# Patient Record
Sex: Male | Born: 1935 | Race: White | Hispanic: No | Marital: Married | Smoking: Former smoker
Health system: Southern US, Community
[De-identification: ages and names within clinical notes are randomized; demographics above are authoritative.]

## PROBLEM LIST (undated history)

## (undated) DIAGNOSIS — K219 Gastro-esophageal reflux disease without esophagitis: Secondary | ICD-10-CM

## (undated) DIAGNOSIS — M5126 Other intervertebral disc displacement, lumbar region: Secondary | ICD-10-CM

## (undated) DIAGNOSIS — J189 Pneumonia, unspecified organism: Secondary | ICD-10-CM

## (undated) DIAGNOSIS — Z8711 Personal history of peptic ulcer disease: Secondary | ICD-10-CM

## (undated) DIAGNOSIS — E7800 Pure hypercholesterolemia, unspecified: Secondary | ICD-10-CM

## (undated) DIAGNOSIS — Z8719 Personal history of other diseases of the digestive system: Secondary | ICD-10-CM

## (undated) DIAGNOSIS — M1990 Unspecified osteoarthritis, unspecified site: Secondary | ICD-10-CM

## (undated) DIAGNOSIS — K759 Inflammatory liver disease, unspecified: Secondary | ICD-10-CM

## (undated) HISTORY — PX: FRACTURE SURGERY: SHX138

## (undated) HISTORY — PX: CATARACT EXTRACTION W/ INTRAOCULAR LENS  IMPLANT, BILATERAL: SHX1307

## (undated) HISTORY — PX: ANKLE SURGERY: SHX546

## (undated) HISTORY — PX: COLONOSCOPY: SHX174

## (undated) HISTORY — PX: LIGAMENT REPAIR: SHX5444

## (undated) HISTORY — PX: INGUINAL HERNIA REPAIR: SUR1180

## (undated) HISTORY — PX: ESOPHAGOGASTRODUODENOSCOPY (EGD) WITH ESOPHAGEAL DILATION: SHX5812

---
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
## 2018-03-13 ENCOUNTER — Encounter (HOSPITAL_COMMUNITY): Payer: Self-pay | Admitting: Emergency Medicine

## 2018-03-13 ENCOUNTER — Emergency Department (HOSPITAL_COMMUNITY): Payer: Medicare Other

## 2018-03-13 ENCOUNTER — Other Ambulatory Visit: Payer: Self-pay

## 2018-03-13 ENCOUNTER — Inpatient Hospital Stay (HOSPITAL_COMMUNITY)
Admission: EM | Admit: 2018-03-13 | Discharge: 2018-03-14 | DRG: 193 | Disposition: A | Discharge status: Home / Self Care | Payer: Medicare Other | Attending: Internal Medicine | Admitting: Internal Medicine

## 2018-03-13 DIAGNOSIS — Z8701 Personal history of pneumonia (recurrent): Secondary | ICD-10-CM | POA: Diagnosis not present

## 2018-03-13 DIAGNOSIS — J9601 Acute respiratory failure with hypoxia: Secondary | ICD-10-CM | POA: Diagnosis present

## 2018-03-13 DIAGNOSIS — J9811 Atelectasis: Secondary | ICD-10-CM | POA: Diagnosis present

## 2018-03-13 DIAGNOSIS — K219 Gastro-esophageal reflux disease without esophagitis: Secondary | ICD-10-CM | POA: Diagnosis present

## 2018-03-13 DIAGNOSIS — G9349 Other encephalopathy: Secondary | ICD-10-CM | POA: Diagnosis present

## 2018-03-13 DIAGNOSIS — N179 Acute kidney failure, unspecified: Secondary | ICD-10-CM | POA: Diagnosis present

## 2018-03-13 DIAGNOSIS — N400 Enlarged prostate without lower urinary tract symptoms: Secondary | ICD-10-CM | POA: Diagnosis present

## 2018-03-13 DIAGNOSIS — E860 Dehydration: Secondary | ICD-10-CM | POA: Diagnosis present

## 2018-03-13 DIAGNOSIS — Z7982 Long term (current) use of aspirin: Secondary | ICD-10-CM

## 2018-03-13 DIAGNOSIS — J159 Unspecified bacterial pneumonia: Secondary | ICD-10-CM | POA: Diagnosis present

## 2018-03-13 DIAGNOSIS — I10 Essential (primary) hypertension: Secondary | ICD-10-CM | POA: Diagnosis present

## 2018-03-13 DIAGNOSIS — Z87891 Personal history of nicotine dependence: Secondary | ICD-10-CM | POA: Diagnosis not present

## 2018-03-13 DIAGNOSIS — K5790 Diverticulosis of intestine, part unspecified, without perforation or abscess without bleeding: Secondary | ICD-10-CM | POA: Diagnosis not present

## 2018-03-13 DIAGNOSIS — Z79899 Other long term (current) drug therapy: Secondary | ICD-10-CM

## 2018-03-13 DIAGNOSIS — G9340 Encephalopathy, unspecified: Secondary | ICD-10-CM | POA: Diagnosis not present

## 2018-03-13 DIAGNOSIS — A419 Sepsis, unspecified organism: Secondary | ICD-10-CM

## 2018-03-13 DIAGNOSIS — E785 Hyperlipidemia, unspecified: Secondary | ICD-10-CM | POA: Diagnosis present

## 2018-03-13 DIAGNOSIS — Z8619 Personal history of other infectious and parasitic diseases: Secondary | ICD-10-CM | POA: Diagnosis not present

## 2018-03-13 HISTORY — DX: Personal history of other diseases of the digestive system: Z87.19

## 2018-03-13 HISTORY — DX: Gastro-esophageal reflux disease without esophagitis: K21.9

## 2018-03-13 HISTORY — DX: Personal history of peptic ulcer disease: Z87.11

## 2018-03-13 HISTORY — DX: Unspecified osteoarthritis, unspecified site: M19.90

## 2018-03-13 HISTORY — DX: Pure hypercholesterolemia, unspecified: E78.00

## 2018-03-13 HISTORY — DX: Pneumonia, unspecified organism: J18.9

## 2018-03-13 HISTORY — DX: Other intervertebral disc displacement, lumbar region: M51.26

## 2018-03-13 HISTORY — DX: Inflammatory liver disease, unspecified: K75.9

## 2018-03-13 LAB — COMPREHENSIVE METABOLIC PANEL
ALT: 28 U/L (ref 0–44)
AST: 34 U/L (ref 15–41)
Albumin: 3.9 g/dL (ref 3.5–5.0)
Alkaline Phosphatase: 80 U/L (ref 38–126)
Anion gap: 14 (ref 5–15)
BUN: 20 mg/dL (ref 8–23)
CO2: 21 mmol/L — ABNORMAL LOW (ref 22–32)
Calcium: 8.8 mg/dL — ABNORMAL LOW (ref 8.9–10.3)
Chloride: 104 mmol/L (ref 98–111)
Creatinine, Ser: 1.36 mg/dL — ABNORMAL HIGH (ref 0.61–1.24)
GFR calc Af Amer: 57 mL/min — ABNORMAL LOW (ref >60)
GFR calc non Af Amer: 49 mL/min — ABNORMAL LOW (ref >60)
Glucose, Bld: 100 mg/dL — ABNORMAL HIGH (ref 70–99)
Potassium: 3.6 mmol/L (ref 3.5–5.1)
Sodium: 139 mmol/L (ref 135–145)
Total Bilirubin: 1.2 mg/dL (ref 0.3–1.2)
Total Protein: 7 g/dL (ref 6.5–8.1)

## 2018-03-13 LAB — CBC WITH DIFFERENTIAL/PLATELET
Abs Immature Granulocytes: 0.01 10*3/uL (ref 0.00–0.07)
Basophils Absolute: 0 10*3/uL (ref 0.0–0.1)
Basophils Relative: 0 %
Eosinophils Absolute: 0 10*3/uL (ref 0.0–0.5)
Eosinophils Relative: 0 %
HCT: 43 % (ref 39.0–52.0)
Hemoglobin: 13.5 g/dL (ref 13.0–17.0)
Immature Granulocytes: 0 %
Lymphocytes Relative: 6 %
Lymphs Abs: 0.4 10*3/uL — ABNORMAL LOW (ref 0.7–4.0)
MCH: 29.4 pg (ref 26.0–34.0)
MCHC: 31.4 g/dL (ref 30.0–36.0)
MCV: 93.7 fL (ref 80.0–100.0)
Monocytes Absolute: 0 10*3/uL — ABNORMAL LOW (ref 0.1–1.0)
Monocytes Relative: 1 %
Neutro Abs: 5.5 10*3/uL (ref 1.7–7.7)
Neutrophils Relative %: 93 %
Platelets: 192 10*3/uL (ref 150–400)
RBC: 4.59 MIL/uL (ref 4.22–5.81)
RDW: 12.7 % (ref 11.5–15.5)
WBC: 5.9 10*3/uL (ref 4.0–10.5)
nRBC: 0 % (ref 0.0–0.2)

## 2018-03-13 LAB — I-STAT CG4 LACTIC ACID, ED
Lactic Acid, Venous: 3.48 mmol/L (ref 0.5–1.9)
Lactic Acid, Venous: 1.15 mmol/L (ref 0.5–1.9)

## 2018-03-13 LAB — RESPIRATORY PANEL BY PCR
ADENOVIRUS-RVPPCR: NOT DETECTED
Bordetella pertussis: NOT DETECTED
CORONAVIRUS NL63-RVPPCR: NOT DETECTED
CORONAVIRUS OC43-RVPPCR: NOT DETECTED
Chlamydophila pneumoniae: NOT DETECTED
Coronavirus 229E: NOT DETECTED
Coronavirus HKU1: NOT DETECTED
Influenza A: NOT DETECTED
Influenza B: NOT DETECTED
Metapneumovirus: NOT DETECTED
Mycoplasma pneumoniae: NOT DETECTED
Parainfluenza Virus 1: NOT DETECTED
Parainfluenza Virus 2: NOT DETECTED
Parainfluenza Virus 3: NOT DETECTED
Parainfluenza Virus 4: NOT DETECTED
Respiratory Syncytial Virus: NOT DETECTED
Rhinovirus / Enterovirus: NOT DETECTED

## 2018-03-13 LAB — URINALYSIS, ROUTINE W REFLEX MICROSCOPIC
Bacteria, UA: NONE SEEN
Bilirubin Urine: NEGATIVE
Glucose, UA: NEGATIVE mg/dL
Ketones, ur: NEGATIVE mg/dL
Leukocytes, UA: NEGATIVE
Nitrite: NEGATIVE
Protein, ur: NEGATIVE mg/dL
Specific Gravity, Urine: 1.017 (ref 1.005–1.030)
pH: 6 (ref 5.0–8.0)

## 2018-03-13 LAB — INFLUENZA PANEL BY PCR (TYPE A & B)
INFLAPCR: NEGATIVE
Influenza B By PCR: NEGATIVE

## 2018-03-13 LAB — AMMONIA: Ammonia: 31 umol/L (ref 9–35)

## 2018-03-13 LAB — PROCALCITONIN: Procalcitonin: 73.66 ng/mL

## 2018-03-13 LAB — MRSA PCR SCREENING: MRSA by PCR: NEGATIVE

## 2018-03-13 LAB — LIPASE, BLOOD: Lipase: 66 U/L — ABNORMAL HIGH (ref 11–51)

## 2018-03-13 MED ORDER — SODIUM CHLORIDE 0.9 % IV SOLN
2.0000 g | Freq: Once | INTRAVENOUS | Status: AC
  Administered 2018-03-13: 2 g via INTRAVENOUS
  Filled 2018-03-13: qty 2

## 2018-03-13 MED ORDER — METRONIDAZOLE IN NACL 5-0.79 MG/ML-% IV SOLN
500.0000 mg | Freq: Three times a day (TID) | INTRAVENOUS | Status: DC
  Administered 2018-03-13: 500 mg via INTRAVENOUS
  Filled 2018-03-13: qty 100

## 2018-03-13 MED ORDER — ONDANSETRON HCL 4 MG PO TABS: 4.0000 mg | ORAL_TABLET | Freq: Four times a day (QID) | ORAL | Status: DC | PRN

## 2018-03-13 MED ORDER — CYANOCOBALAMIN 500 MCG PO TABS
500.0000 ug | ORAL_TABLET | Freq: Every day | ORAL | Status: DC
  Administered 2018-03-13 – 2018-03-14 (×2): 500 ug via ORAL
  Filled 2018-03-13 (×2): qty 1

## 2018-03-13 MED ORDER — ENOXAPARIN SODIUM 40 MG/0.4ML ~~LOC~~ SOLN
40.0000 mg | SUBCUTANEOUS | Status: DC
  Administered 2018-03-13: 40 mg via SUBCUTANEOUS
  Filled 2018-03-13: qty 0.4

## 2018-03-13 MED ORDER — ONDANSETRON HCL 4 MG/2ML IJ SOLN: 4.0000 mg | Freq: Four times a day (QID) | INTRAMUSCULAR | Status: DC | PRN

## 2018-03-13 MED ORDER — LACTATED RINGERS IV SOLN: INTRAVENOUS | Status: DC

## 2018-03-13 MED ORDER — ALPRAZOLAM 0.25 MG PO TABS
0.2500 mg | ORAL_TABLET | Freq: Every evening | ORAL | Status: DC | PRN
  Administered 2018-03-13: 0.25 mg via ORAL
  Filled 2018-03-13: qty 1

## 2018-03-13 MED ORDER — ACETAMINOPHEN 650 MG RE SUPP: 650.0000 mg | Freq: Four times a day (QID) | RECTAL | Status: DC | PRN

## 2018-03-13 MED ORDER — ASPIRIN EC 81 MG PO TBEC
81.0000 mg | DELAYED_RELEASE_TABLET | Freq: Every day | ORAL | Status: DC
  Administered 2018-03-13 – 2018-03-14 (×2): 81 mg via ORAL
  Filled 2018-03-13 (×2): qty 1

## 2018-03-13 MED ORDER — SENNA 8.6 MG PO TABS
1.0000 | ORAL_TABLET | Freq: Two times a day (BID) | ORAL | Status: DC
  Administered 2018-03-13 – 2018-03-14 (×2): 8.6 mg via ORAL
  Filled 2018-03-13 (×2): qty 1

## 2018-03-13 MED ORDER — LACTATED RINGERS IV SOLN
INTRAVENOUS | Status: AC
  Administered 2018-03-13 – 2018-03-14 (×2): via INTRAVENOUS

## 2018-03-13 MED ORDER — SODIUM CHLORIDE 0.9 % IV BOLUS
1000.0000 mL | Freq: Once | INTRAVENOUS | Status: AC
  Administered 2018-03-13: 1000 mL via INTRAVENOUS

## 2018-03-13 MED ORDER — ACETAMINOPHEN 325 MG PO TABS
650.0000 mg | ORAL_TABLET | Freq: Once | ORAL | Status: AC
  Administered 2018-03-13: 650 mg via ORAL
  Filled 2018-03-13: qty 2

## 2018-03-13 MED ORDER — VANCOMYCIN HCL IN DEXTROSE 1-5 GM/200ML-% IV SOLN: 1000.0000 mg | Freq: Once | INTRAVENOUS | Status: DC

## 2018-03-13 MED ORDER — TAMSULOSIN HCL 0.4 MG PO CAPS
0.4000 mg | ORAL_CAPSULE | Freq: Every day | ORAL | Status: DC
  Administered 2018-03-13 – 2018-03-14 (×2): 0.4 mg via ORAL
  Filled 2018-03-13 (×2): qty 1

## 2018-03-13 MED ORDER — ATORVASTATIN CALCIUM 40 MG PO TABS
40.0000 mg | ORAL_TABLET | Freq: Every day | ORAL | Status: DC
  Administered 2018-03-13 – 2018-03-14 (×2): 40 mg via ORAL
  Filled 2018-03-13 (×2): qty 1

## 2018-03-13 MED ORDER — VANCOMYCIN HCL 10 G IV SOLR: 1250.0000 mg | INTRAVENOUS | Status: DC

## 2018-03-13 MED ORDER — ACETAMINOPHEN 325 MG PO TABS: 650.0000 mg | ORAL_TABLET | Freq: Four times a day (QID) | ORAL | Status: DC | PRN

## 2018-03-13 MED ORDER — VANCOMYCIN HCL 10 G IV SOLR
1500.0000 mg | Freq: Once | INTRAVENOUS | Status: AC
  Administered 2018-03-13: 1500 mg via INTRAVENOUS
  Filled 2018-03-13: qty 1500

## 2018-03-13 MED ORDER — PANTOPRAZOLE SODIUM 40 MG PO TBEC
40.0000 mg | DELAYED_RELEASE_TABLET | Freq: Every day | ORAL | Status: DC
  Administered 2018-03-13 – 2018-03-14 (×2): 40 mg via ORAL
  Filled 2018-03-13 (×2): qty 1

## 2018-03-13 MED ORDER — SODIUM CHLORIDE 0.9 % IV SOLN
2.0000 g | INTRAVENOUS | Status: DC
  Filled 2018-03-13: qty 2

## 2018-03-13 NOTE — ED Notes (Signed)
ED TO INPATIENT HANDOFF REPORT  Name/Age/Gender Jimmy Frank 82 y.o. male  Code Status    Code Status Orders  (From admission, onward)         Start     Ordered   03/13/18 1227  Full code  Continuous     03/13/18 1228        Code Status History    This patient has a current code status but no historical code status.      Home/SNF/Other Home  Chief Complaint sick  Level of Care/Admitting Diagnosis ED Disposition    ED Disposition Condition Comment   Admit  [facility] [[id number]]  Level of Care: Medical Telemetry [104]  Diagnosis: Acute respiratory failure with hypoxia [facility](HCC) [[id number]]) [[id number]]  Admitting Physician: Erenest BlankBUTCHER, ELIZABETH A [[id number]]  Attending Physician: Burns SpainBUTCHER, ELIZABETH A [phone][[id number]]  Estimated length of stay: past midnight tomorrow  Certification:: I certify this patient will need inpatient services for at least 2 midnights  PT Class (Do Not Modify): Inpatient [101]  PT Acc Code (Do Not Modify): Private [1]       Medical History History reviewed. No pertinent past medical history.  Allergies No Known Allergies  IV Location/Drains/Wounds Patient Lines/Drains/Airways Status   Active Line/Drains/Airways    Name:   Placement date:   Placement time:   Site:   Days:   Peripheral IV 03/13/18 Left Antecubital   03/13/18    -    Antecubital   less than 1   Peripheral IV 03/13/18 Right Antecubital   03/13/18    1005    Antecubital   less than 1          Labs/Imaging Results for orders placed or performed during the hospital encounter of 03/13/18 (from the past 48 hour(s))  Ammonia     Status: None   Collection Time: 03/13/18  9:42 AM  Result Value Ref Range   Ammonia 31 9 - 35 umol/L    Comment: Performed at [facility], [address] [place], [place] [address]  Comprehensive metabolic panel     Status: Abnormal   Collection Time: 03/13/18  9:49 AM  Result Value Ref Range   Sodium 139 135 - 145 mmol/L   Potassium 3.6 3.5 - 5.1 mmol/L   Chloride 104 98 - 111 mmol/L   CO2 21 (L) 22 - 32 mmol/L   Glucose, Bld 100 (H) 70 - 99 mg/dL   BUN 20 8 - 23 mg/dL   Creatinine, Ser 8.651.36 (H) 0.61 - 1.24 mg/dL   Calcium 8.8 (L) 8.9 - 10.3 mg/dL   Total Protein 7.0 6.5 - 8.1 g/dL   Albumin 3.9 3.5 - 5.0 g/dL   AST 34 15 - 41 U/L   ALT 28 0 - 44 U/L   Alkaline Phosphatase 80 38 - 126 U/L   Total Bilirubin 1.2 0.3 - 1.2 mg/dL   GFR calc non Af Amer 49 (L) >60 mL/min   GFR calc Af Amer 57 (L) >60 mL/min   Anion gap 14 5 - 15    Comment: Performed at [facility], [address] [place], [place] [address]  CBC WITH DIFFERENTIAL     Status: Abnormal   Collection Time: 03/13/18  9:49 AM  Result Value Ref Range   WBC 5.9 4.0 - 10.5 K/uL   RBC 4.59 4.22 - 5.81 MIL/uL   Hemoglobin 13.5 13.0 - 17.0 g/dL   HCT 62.943.0 52.839.0 - 41.352.0 %  MCV 93.7 80.0 - 100.0 fL   MCH 29.4 26.0 - 34.0 pg   MCHC 31.4 30.0 - 36.0 g/dL   RDW 98.1 19.1 - 47.8 %   Platelets 192 150 - 400 K/uL   nRBC 0.0 0.0 - 0.2 %   Neutrophils Relative % 93 %   Neutro Abs 5.5 1.7 - 7.7 K/uL   Lymphocytes Relative 6 %   Lymphs Abs 0.4 (L) 0.7 - 4.0 K/uL   Monocytes Relative 1 %   Monocytes Absolute 0.0 (L) 0.1 - 1.0 K/uL   Eosinophils Relative 0 %   Eosinophils Absolute 0.0 0.0 - 0.5 K/uL   Basophils Relative 0 %   Basophils Absolute 0.0 0.0 - 0.1 K/uL   WBC Morphology See Note     Comment: Increased Bands. >20% Bands   Immature Granulocytes 0 %   Abs Immature Granulocytes 0.01 0.00 - 0.07 K/uL    Comment: Performed at [facility], [address], [place], [place] [address]  Lipase, blood     Status: Abnormal   Collection Time: 03/13/18  9:49 AM  Result Value Ref Range   Lipase 66 (H) 11 - 51 U/L    Comment: Performed at [facility], [address], [place], [place] [address]  Urinalysis, Routine w reflex microscopic     Status: Abnormal   Collection Time: 03/13/18  9:56 AM  Result Value Ref Range   Color, Urine  YELLOW YELLOW   APPearance CLEAR CLEAR   Specific Gravity, Urine 1.017 1.005 - 1.030   pH 6.0 5.0 - 8.0   Glucose, UA NEGATIVE NEGATIVE mg/dL   Hgb urine dipstick SMALL (A) NEGATIVE   Bilirubin Urine NEGATIVE NEGATIVE   Ketones, ur NEGATIVE NEGATIVE mg/dL   Protein, ur NEGATIVE NEGATIVE mg/dL   Nitrite NEGATIVE NEGATIVE   Leukocytes, UA NEGATIVE NEGATIVE   RBC / HPF 0-5 0 - 5 RBC/hpf   WBC, UA 0-5 0 - 5 WBC/hpf   Bacteria, UA NONE SEEN NONE SEEN   Squamous Epithelial / LPF 0-5 0 - 5   Mucus PRESENT     Comment: Performed at [facility], [address] [place], [place] [address]  I-Stat CG4 Lactic Acid, ED     Status: Abnormal   Collection Time: 03/13/18 10:07 AM  Result Value Ref Range   Lactic Acid, Venous 3.48 (HH) 0.5 - 1.9 mmol/L   Comment NOTIFIED PHYSICIAN   Influenza panel by PCR (type A & B)     Status: None   Collection Time: 03/13/18 10:23 AM  Result Value Ref Range   Influenza A By PCR NEGATIVE NEGATIVE   Influenza B By PCR NEGATIVE NEGATIVE    Comment: (NOTE) The Xpert Xpress Flu assay is intended as an aid in the diagnosis of  influenza and should not be used as a sole basis for treatment.  This  assay is FDA approved for nasopharyngeal swab specimens only. Nasal  washings and aspirates are unacceptable for Xpert Xpress Flu testing. Performed at [facility], [address] [place], [place] [address]   I-Stat CG4 Lactic Acid, ED     Status: None   Collection Time: 03/13/18 12:16 PM  Result Value Ref Range   Lactic Acid, Venous 1.15 0.5 - 1.9 mmol/L   Dg Chest Port 1 View  Result Date: 03/13/2018 CLINICAL DATA:  Shortness of breath, fever. EXAM: PORTABLE CHEST 1 VIEW COMPARISON:  None. FINDINGS: The heart size and mediastinal contours are  within normal limits. No pneumothorax or pleural effusion is noted. Probable mild bibasilar subsegmental atelectasis is noted. The visualized skeletal structures are unremarkable. IMPRESSION: Probable mild  bibasilar subsegmental atelectasis. Electronically Signed   By: Lupita RaiderJames  Green Jr, M.D.   On: 03/13/2018 10:46    Pending Labs Unresulted Labs (From admission, onward)    Start     Ordered   03/14/18 0500  Basic metabolic panel  Tomorrow morning,   R     03/13/18 1248   03/14/18 0500  CBC  Tomorrow morning,   R     03/13/18 1248   03/13/18 1229  Respiratory Panel by PCR  (Respiratory virus panel with precautions)  Once,   R     03/13/18 1228   03/13/18 0941  Urine culture  ONCE - STAT,   STAT     03/13/18 0942   03/13/18 0940  Blood Culture (routine x 2)  BLOOD CULTURE X 2,   STAT     03/13/18 0942          Vitals/Pain Today's Vitals   03/13/18 1130 03/13/18 1145 03/13/18 1200 03/13/18 1215  BP: 113/63 111/62 106/65 109/64  Pulse: 93 92 89 88  Resp: (!) 28 (!) 26 (!) 26 (!) 28  Temp:      TempSrc:      SpO2: 96% 97% 97% 96%  Weight:      Height:      PainSc:        Isolation Precautions Droplet precaution  Medications Medications  ceFEPIme (MAXIPIME) 2 g in sodium chloride 0.9 % 100 mL IVPB (has no administration in time range)  aspirin EC tablet 81 mg (has no administration in time range)  atorvastatin (LIPITOR) tablet 40 mg (has no administration in time range)  ALPRAZolam (XANAX) tablet 0.25 mg (has no administration in time range)  pantoprazole (PROTONIX) EC tablet 40 mg (has no administration in time range)  tamsulosin (FLOMAX) capsule 0.4 mg (has no administration in time range)  vitamin B-12 (CYANOCOBALAMIN) tablet 500 mcg (has no administration in time range)  enoxaparin (LOVENOX) injection 40 mg (has no administration in time range)  acetaminophen (TYLENOL) tablet 650 mg (has no administration in time range)    Or  acetaminophen (TYLENOL) suppository 650 mg (has no administration in time range)  senna (SENOKOT) tablet 8.6 mg (has no administration in time range)  ondansetron (ZOFRAN) tablet 4 mg (has no administration in time range)    Or  ondansetron  (ZOFRAN) injection 4 mg (has no administration in time range)  lactated ringers infusion (has no administration in time range)  sodium chloride 0.9 % bolus 1,000 mL (0 mLs Intravenous Stopped 03/13/18 1121)  sodium chloride 0.9 % bolus 1,000 mL (0 mLs Intravenous Stopped 03/13/18 1121)  acetaminophen (TYLENOL) tablet 650 mg (650 mg Oral Given 03/13/18 1007)  ceFEPIme (MAXIPIME) 2 g in sodium chloride 0.9 % 100 mL IVPB (0 g Intravenous Stopped 03/13/18 1121)  vancomycin (VANCOCIN) 1,500 mg in sodium chloride 0.9 % 500 mL IVPB (1,500 mg Intravenous New Bag/Given 03/13/18 1123)    Mobility walks

## 2018-03-13 NOTE — H&P (Addendum)
Date: 03/13/2018               Patient Name:  Jimmy Frank MRN: [id number]  DOB: 01-22-1936 Age / Sex: 82 y.o., male   PCP: Patient, No Pcp Per         Medical Service: Internal Medicine Teaching Service         Attending Physician: Jimmy Frank, Jimmy MilesElizabeth A, MD    First Contact: Jimmy Frank, Jimmy Frank Pager: [phone]  Second Contact: Jimmy Frank, Jimmy Frank Pager: [phone]       After Hours (After 5p/  First Contact Pager: [phone]  weekends / holidays): Second Contact Pager: [phone]   Chief Complaint: Confusion  History of Present Illness: [name] is Frank 82 yo M w/ PMH of diverticulosis, HTN, HLD, and recurrent pneumonias who presented to the ED with acute encephalopathy and disorientation. He was examined at bedside in ED with wife present. Patient seems back to baseline mentation now. States that he was out playing golf yesterday and felt fine until around the 11th hole when he began to feel extremely fatigued and "off." Subsequently went home and developed Frank slightly productive cough, headaches, fevers, and chills. He felt completely drained and tried to sleep it off after taking some robitussin, water, and tylenol. This AM he has felt nauseous but did not vomit. His temperature was elevated and he subsequently came to the ED for further evaluation. He has had pneumonia multiple times in the past and tells us that this is exactly how he felt with prior episodes with the exception that he does feel more confused than prior episodes. He does not think he has had any sick contact recently.   At baseline he is usually able to do everything he wants without SOB. Does not have Frank history of asthma, COPD, or other lung pathology. However, his wife clarifies that several years ago he was diagnosed with Frank "pneumonia ball," which is what set off these recurrent pneumonia episodes. Also expresses that he has Frank history of pseudomonas bacteremia after cutting his leg and falling in Frank pond.  Previously used tobacco, < 1PPD for 25-30 years. Quit smoking >25 years ago. Did receive his influenza and both pneumonia vaccinations.  Denies CP, SOB, dysuria, diarrhea, localized pain, blurry vision.  In the ED, he was found to be febrile at 104.67F, with elevated lactate. Chest X-ray performed showing bibasilar atelectasis. Blood cultures were collected and he was started on vanc, flagyl, and cefepime. Internal medicine was then consulted for admission.  Meds:  Current Meds  Medication Sig  . ALPRAZolam (XANAX) 0.25 MG tablet Take 0.25 mg by mouth at bedtime as needed for sleep.  Marland Kitchen. aspirin EC 81 MG tablet Take 81 mg by mouth daily.  Marland Kitchen. atorvastatin (LIPITOR) 40 MG tablet Take 40 mg by mouth daily.  Marland Kitchen. omeprazole (PRILOSEC) 20 MG capsule Take 20 mg by mouth daily before breakfast.  . tamsulosin (FLOMAX) 0.4 MG CAPS capsule Take 0.4 mg by mouth daily.  . vitamin B-12 (CYANOCOBALAMIN) 500 MCG tablet Take 500 mcg by mouth daily.   Allergies: Allergies as of 03/13/2018  . (No Known Allergies)   Family History:  No significant relevant family history  Social History:  Currently retired. Plays golf regularly. Able to perform ADLs, IADLs without issues. Lives at home with wife. Has Frank internist as Frank Network engineerneighbor. Social drinker. Hx of tobacco use ~25pack years. Quit more than 20 years ago.  Review of Systems: Frank complete ROS was negative except  as per HPI.  Physical Exam: Blood pressure 109/64, pulse 88, temperature (!) 104.4 F (40.2 C), temperature source Rectal, resp. rate (!) 28, height 6' (1.829 m), weight 81.6 kg, SpO2 96 %.  Gen: Well-developed, well nourished, NAD HEENT: NCAT head, hearing intact, EOMI, PERRL, No nasal discharge, MMM Neck: supple, ROM intact, no JVD, no cervical adenopathy CV: RRR, S1, S2 normal, No rubs, no murmurs, no gallops Pulm: Bibasilar crackles ,no wheezes, no dullness to percussion  Abd: Soft, BS+, NTND, No rebound, no guarding Extm: ROM intact, Peripheral  pulses intact, No peripheral edema Skin: Dry, Warm, normal turgor, no rashes, lesions, wounds.  Neuro: AAOx3  EKG: personally reviewed my interpretation is sinus tachycardia, significant artifacts, no ST elevation or depression, prolonged QTc.   CXR: personally reviewed my interpretation is poor inspiratory film, left lower lobe opacities, right lower lobe perihilar opacities, no obvious pleural effusion, no pulmonary edema.   Assessment & Plan by Problem: Active Problems:   Acute respiratory failure with hypoxia [facility](HCC)  [name] is Frank 82 yo M w/ PMH of diverticulosis, HTN, HLD, and recurrent pneumonias who presented dyspnea and altered mental status. Found to have acute onset nausea, encephalopathy, fevers and chills with dry cough. Most likely to be viral bronchitis considering acute onset and negative X-ray findings but cannot rule out pneumonia. Will admit for antibiotic therapy while awaiting blood culture results.   Acute hypoxic respiratory failure with encephalopathy 2/2 bacterial vs viral pneumonia  O2 Sat down to 80s at EMS needing 2L [place]. On admission, 104.62F, tachypneic at 33 on admission, BP 107/53. X-ray show opacities read as bibasilar atelectasis. Lactate 3.48->1.15 - C/w cefepime - Most likely pulmonary source considering respiratory failure, low risk for MRSA. Can d/c flagyl, vancomycin. - F/u Respiratory viral panel, influenza panel  - F/u Blood cultures - Trend CBC - Keep O2 sat >88  Acute Kidney Injury 2/2 dehydration Most likely from increasing fluid requirement in setting of active infection. Received 2L bolus in ED. Baseline creatinine 0.9 Admit creatinine 1.36 - Trend BMPs - Avoid nephrotoxic meds - Maintenance fluid at LR 75cc/hr for 20 hours   Hx of BPH - c/w home meds: tamsulosin 0.4mg  daily  Hx of GERD - C/w home meds: pantoprazole 40mg  daily  Hx of HLD - C/w atorvastatin 40mg  daily  DVT prophx: Lovenox Diet: Cardiac Bowel: Senokot Code: Full  code  Dispo: Admit patient to Inpatient with expected length of stay greater than 2 midnights.  Signed: Theotis BarrioLee, Devora Tortorella K, MD 03/13/2018, 12:56 PM  Pager: [phone]

## 2018-03-13 NOTE — ED Provider Notes (Signed)
[facility]   CSN: [id number] Arrival date & time:        History   Chief Complaint Chief Complaint  Patient presents with  . Altered Mental Status    HPI Jimmy Frank is a 82 y.o. male.  The history is provided by the patient, the EMS personnel and the spouse. The history is limited by the condition of the patient.  Altered Mental Status   This is a new problem. The current episode started 12 to 24 hours ago. Associated symptoms include confusion. Pertinent negatives include no unresponsiveness, no agitation and no hallucinations. His past medical history does not include diabetes, seizures, liver disease, CVA, hypertension, COPD, dementia or heart disease.  Fever   This is a new problem. The current episode started yesterday. The problem occurs constantly. The problem has not changed since onset.The maximum temperature noted was more than 104 F. The temperature was taken using a rectal thermometer. Associated symptoms include cough. Pertinent negatives include no chest pain, no diarrhea, no vomiting, no congestion and no headaches. He has tried acetaminophen for the symptoms. The treatment provided no relief.    No past medical history on file.  There are no active problems to display for this patient.   History reviewed. No pertinent surgical history.      Home Medications    Prior to Admission medications   Not on File    Family History No family history on file.  Social History Social History   Tobacco Use  . Smoking status: Not on file  Substance Use Topics  . Alcohol use: Not on file  . Drug use: Not on file     Allergies   Patient has no allergy information on record.   Review of Systems Review of Systems  Constitutional: Positive for chills, diaphoresis and fever. Negative for fatigue.  HENT: Negative for congestion.   Eyes: Negative for visual disturbance.  Respiratory: Positive  for cough and shortness of breath. Negative for chest tightness and wheezing.   Cardiovascular: Negative for chest pain, palpitations and leg swelling.  Gastrointestinal: Negative for abdominal pain, constipation, diarrhea, nausea and vomiting.  Genitourinary: Negative for flank pain and frequency.  Musculoskeletal: Negative for back pain, neck pain and neck stiffness.  Neurological: Negative for light-headedness and headaches.  Psychiatric/Behavioral: Positive for confusion. Negative for agitation and hallucinations.  All other systems reviewed and are negative.    Physical Exam Updated Vital Signs BP (!) 152/71 (BP Location: Right Arm)   Pulse (!) 122   Temp (!) 104.4 F (40.2 C) (Rectal)   Resp (!) 33   Ht 6' (1.829 m)   Wt 81.6 kg   SpO2 93%   BMI 24.41 kg/m   Physical Exam Vitals signs and nursing note reviewed.  Constitutional:      General: He is not in acute distress.    Appearance: He is ill-appearing. He is not toxic-appearing or diaphoretic.  HENT:     Head: Normocephalic.     Nose: Nose normal. No congestion or rhinorrhea.     Mouth/Throat:     Mouth: Mucous membranes are moist.     Pharynx: No oropharyngeal exudate or posterior oropharyngeal erythema.  Eyes:     Pupils: Pupils are equal, round, and reactive to light.  Neck:     Musculoskeletal: Normal range of motion. No neck rigidity or muscular tenderness.  Cardiovascular:     Rate and Rhythm: Tachycardia present.  Pulses: Normal pulses.     Heart sounds: No murmur.  Pulmonary:     Effort: Tachypnea present. No respiratory distress.     Breath sounds: Rhonchi present. No wheezing or rales.  Chest:     Chest wall: No tenderness.  Abdominal:     General: Abdomen is flat. There is no distension.     Tenderness: There is no abdominal tenderness.  Musculoskeletal:        General: No tenderness.     Right lower leg: No edema.     Left lower leg: No edema.  Lymphadenopathy:     Cervical: No cervical  adenopathy.  Skin:    Capillary Refill: Capillary refill takes less than 2 seconds.     Coloration: Skin is not jaundiced.     Findings: No erythema or rash.  Neurological:     General: No focal deficit present.     Mental Status: He is alert.     Cranial Nerves: No cranial nerve deficit.     Sensory: No sensory deficit.     Motor: No weakness.  Psychiatric:        Mood and Affect: Mood normal.      ED Treatments / Results  Labs (all labs ordered are listed, but only abnormal results are displayed) Labs Reviewed  COMPREHENSIVE METABOLIC PANEL - Abnormal; Notable for the following components:      Result Value   CO2 21 (*)    Glucose, Bld 100 (*)    Creatinine, Ser 1.36 (*)    Calcium 8.8 (*)    GFR calc non Af Amer 49 (*)    GFR calc Af Amer 57 (*)    All other components within normal limits  CBC WITH DIFFERENTIAL/PLATELET - Abnormal; Notable for the following components:   Lymphs Abs 0.4 (*)    Monocytes Absolute 0.0 (*)    All other components within normal limits  URINALYSIS, ROUTINE W REFLEX MICROSCOPIC - Abnormal; Notable for the following components:   Hgb urine dipstick SMALL (*)    All other components within normal limits  LIPASE, BLOOD - Abnormal; Notable for the following components:   Lipase 66 (*)    All other components within normal limits  I-STAT CG4 LACTIC ACID, ED - Abnormal; Notable for the following components:   Lactic Acid, Venous 3.48 (*)    All other components within normal limits  CULTURE, BLOOD (ROUTINE X 2)  CULTURE, BLOOD (ROUTINE X 2)  URINE CULTURE  RESPIRATORY PANEL BY PCR  AMMONIA  INFLUENZA PANEL BY PCR (TYPE A & B)  PROCALCITONIN  I-STAT CG4 LACTIC ACID, ED    EKG EKG Interpretation  Date/Time:  Tuesday March 13 2018 09:36:16 EST Ventricular Rate:  127 PR Interval:    QRS Duration: 75 QT Interval:  343 QTC Calculation: 501 R Axis:   73 Text Interpretation:  Sinus tachycardia Repolarization abnormality, prob rate  related Prolonged QT interval Artifact in lead(s) II III aVR aVF V1 V2 V3 V4 V5 V6 no prior ECG for comparison.  No STEMI Confirmed by Theda Belfast ([id number]) on 03/13/2018 11:24:09 AM   Radiology Dg Chest Port 1 View  Result Date: 03/13/2018 CLINICAL DATA:  Shortness of breath, fever. EXAM: PORTABLE CHEST 1 VIEW COMPARISON:  None. FINDINGS: The heart size and mediastinal contours are within normal limits. No pneumothorax or pleural effusion is noted. Probable mild bibasilar subsegmental atelectasis is noted. The visualized skeletal structures are unremarkable. IMPRESSION: Probable mild bibasilar subsegmental atelectasis. Electronically Signed  By: Lupita RaiderJames  Green Jr, M.D.   On: 03/13/2018 10:46    Procedures Procedures (including critical care time)  CRITICAL CARE Performed by: Canary Brimhristopher J Saman Umstead Total critical care time: 45 minutes Critical care time was exclusive of separately billable procedures and treating other patients. Critical care was necessary to treat or prevent imminent or life-threatening deterioration. Critical care was time spent personally by me on the following activities: development of treatment plan with patient and/or surrogate as well as nursing, discussions with consultants, evaluation of patient's response to treatment, examination of patient, obtaining history from patient or surrogate, ordering and performing treatments and interventions, ordering and review of laboratory studies, ordering and review of radiographic studies, pulse oximetry and re-evaluation of patient's condition.   Medications Ordered in ED Medications  ceFEPIme (MAXIPIME) 2 g in sodium chloride 0.9 % 100 mL IVPB (has no administration in time range)  aspirin EC tablet 81 mg (has no administration in time range)  atorvastatin (LIPITOR) tablet 40 mg (has no administration in time range)  ALPRAZolam (XANAX) tablet 0.25 mg (has no administration in time range)  pantoprazole (PROTONIX) EC tablet 40  mg (has no administration in time range)  tamsulosin (FLOMAX) capsule 0.4 mg (has no administration in time range)  vitamin B-12 (CYANOCOBALAMIN) tablet 500 mcg (has no administration in time range)  enoxaparin (LOVENOX) injection 40 mg (has no administration in time range)  acetaminophen (TYLENOL) tablet 650 mg (has no administration in time range)    Or  acetaminophen (TYLENOL) suppository 650 mg (has no administration in time range)  senna (SENOKOT) tablet 8.6 mg (has no administration in time range)  ondansetron (ZOFRAN) tablet 4 mg (has no administration in time range)    Or  ondansetron (ZOFRAN) injection 4 mg (has no administration in time range)  lactated ringers infusion (has no administration in time range)  sodium chloride 0.9 % bolus 1,000 mL (0 mLs Intravenous Stopped 03/13/18 1121)  sodium chloride 0.9 % bolus 1,000 mL (0 mLs Intravenous Stopped 03/13/18 1121)  acetaminophen (TYLENOL) tablet 650 mg (650 mg Oral Given 03/13/18 1007)  ceFEPIme (MAXIPIME) 2 g in sodium chloride 0.9 % 100 mL IVPB (0 g Intravenous Stopped 03/13/18 1121)  vancomycin (VANCOCIN) 1,500 mg in sodium chloride 0.9 % 500 mL IVPB (0 mg Intravenous Stopped 03/13/18 1439)     Initial Impression / Assessment and Plan / ED Course  I have reviewed the triage vital signs and the nursing notes.  Pertinent labs & imaging results that were available during my care of the patient were reviewed by me and considered in my medical decision making (see chart for details).     Jimmy Frank is a 82 y.o. male who has a history of prior pneumonia who presents for fevers, chills, productive cough, shortness of breath and altered mental status.  Patient is brought in by EMS.  Patient is accompanied by family as well.  They report that patient played a normal round of golf yesterday and then in the afternoon started having some fevers and chills.  Patient started having a cough that is been intermittently productive  of sputum.  This morning patient woke up and was acting more confused.  Patient had a rectal temperature on arrival of 104.4.  Patient had oxygen saturations in the 80s with EMS and had coarse breath sounds.  Patient was started on 2 L nasal cannula oxygen for oxygen supplementation in route.  Patient was denying any chest pain, palpitations, headache, neck stiffness, or neurologic complaints.  Patient is somewhat confused on conversation.  On exam, patient is full range of motion of neck.  Patient had no neck stiffness or rigidity.  No focal neurologic deficit seen on initial exam.  Patient is confused with conversation.  Pupils are symmetric and reactive bilaterally.  Normal extraocular movements.  Lungs had coarse breath sounds worse on the left than the right.  No significant wheezing.  Chest and abdomen were nontender.  Back is nontender.  Vital signs reveal tachycardia, tachypnea, fever of 104.4 rectal, and hypoxia.  Blood pressure is not hypotensive.  Patient was made a code sepsis with a likely source of pneumonia.  Given lack of neck pain, neck stiffness, headache have low suspicion for meningitis.  Suspect the confusion is related to his overall sepsis.  Patient given broad-spectrum antibiotics, fluids, and will have laboratory and imaging testing.  Anticipate admission for sepsis.  Patient's x-ray shows atelectasis however clinically I am concerned about pneumonia.  Lactic acid improved.  Flu test negative.  Urine negative.  Patient was feeling better after antibiotics and fluids.  Mild elevation in creatinine at 1.36.  Patient will be admitted for further management of new hypoxia and altered mental status in the setting of infection.  Patient admitted in stable condition.   Final Clinical Impressions(s) / ED Diagnoses   Final diagnoses:  Sepsis, due to unspecified organism, unspecified whether acute organ dysfunction present [facility])    ED Discharge Orders    None      Clinical Impression: 1. Sepsis, due to unspecified organism, unspecified whether acute organ dysfunction present (HCC)   2. Sepsis (HCC)     Disposition: Admit  This note was prepared with assistance of Dragon voice recognition software. Occasional wrong-word or sound-a-like substitutions may have occurred due to the inherent limitations of voice recognition software.     Christie Viscomi, Canary Brim, MD 03/13/18 1535

## 2018-03-13 NOTE — Progress Notes (Signed)
Pharmacy Antibiotic Note  Levada SchillingFrederick Thomas Frank is a 82 y.o. male admitted on 03/13/2018 with sepsis. Pharmacy has been consulted for vancomycin and cefepime dosing. Pt is febrile with Tmax 104.4 and WBC is WNL. Scr is mildly elevated at 1.36. Lactic acid is 3.48.   Plan: Vancomycin 1500mg  IV x 1 then 1250gm IV Q24H Cefepime 2gm IV Q24H F/u renal fxn, C&S, clinical status and peak/trough at SS  Height: 6' (182.9 cm) Weight: 180 lb (81.6 kg) IBW/kg (Calculated) : 77.6  Temp (24hrs), Avg:104.4 F (40.2 C), Min:104.4 F (40.2 C), Max:104.4 F (40.2 C)  No results for input(s): WBC, CREATININE, LATICACIDVEN, VANCOTROUGH, VANCOPEAK, VANCORANDOM, GENTTROUGH, GENTPEAK, GENTRANDOM, TOBRATROUGH, TOBRAPEAK, TOBRARND, AMIKACINPEAK, AMIKACINTROU, AMIKACIN in the last 168 hours.  CrCl cannot be calculated (No successful lab value found.).    No Known Allergies  Antimicrobials this admission: Vanc 12/31>> Cefepime 12/31>> Flagyl 12/31>>  Dose adjustments this admission: N/A  Microbiology results: Pending  Thank you for allowing pharmacy to be a part of this patient's care.  Saidee Geremia, Drake LeachRachel Lynn 03/13/2018 9:43 AM

## 2018-03-13 NOTE — ED Notes (Signed)
Attempted report x2, RN unavailable.  

## 2018-03-13 NOTE — ED Triage Notes (Signed)
Per EMS pt from home woke up feeling weak nausea and AMS reported.  Pt normally AOx4 Currently answers questions and follow commands but is disoriented place and time. Pt has a cough and is sinus tach.

## 2018-03-13 NOTE — ED Notes (Signed)
Attempted report x1, RN unavailable  

## 2018-03-14 DIAGNOSIS — K219 Gastro-esophageal reflux disease without esophagitis: Secondary | ICD-10-CM

## 2018-03-14 DIAGNOSIS — J159 Unspecified bacterial pneumonia: Principal | ICD-10-CM

## 2018-03-14 DIAGNOSIS — G9340 Encephalopathy, unspecified: Secondary | ICD-10-CM

## 2018-03-14 DIAGNOSIS — E860 Dehydration: Secondary | ICD-10-CM

## 2018-03-14 DIAGNOSIS — K5790 Diverticulosis of intestine, part unspecified, without perforation or abscess without bleeding: Secondary | ICD-10-CM

## 2018-03-14 DIAGNOSIS — Z8701 Personal history of pneumonia (recurrent): Secondary | ICD-10-CM

## 2018-03-14 DIAGNOSIS — N400 Enlarged prostate without lower urinary tract symptoms: Secondary | ICD-10-CM

## 2018-03-14 DIAGNOSIS — J9601 Acute respiratory failure with hypoxia: Secondary | ICD-10-CM

## 2018-03-14 DIAGNOSIS — I10 Essential (primary) hypertension: Secondary | ICD-10-CM

## 2018-03-14 DIAGNOSIS — Z79899 Other long term (current) drug therapy: Secondary | ICD-10-CM

## 2018-03-14 DIAGNOSIS — E785 Hyperlipidemia, unspecified: Secondary | ICD-10-CM

## 2018-03-14 DIAGNOSIS — N179 Acute kidney failure, unspecified: Secondary | ICD-10-CM

## 2018-03-14 DIAGNOSIS — Z8619 Personal history of other infectious and parasitic diseases: Secondary | ICD-10-CM

## 2018-03-14 LAB — BASIC METABOLIC PANEL
Anion gap: 7 (ref 5–15)
BUN: 21 mg/dL (ref 8–23)
CO2: 22 mmol/L (ref 22–32)
Calcium: 7.5 mg/dL — ABNORMAL LOW (ref 8.9–10.3)
Chloride: 109 mmol/L (ref 98–111)
Creatinine, Ser: 1.16 mg/dL (ref 0.61–1.24)
GFR calc Af Amer: >60 mL/min (ref >60)
GFR calc non Af Amer: 59 mL/min — ABNORMAL LOW (ref >60)
Glucose, Bld: 92 mg/dL (ref 70–99)
Potassium: 3.5 mmol/L (ref 3.5–5.1)
Sodium: 138 mmol/L (ref 135–145)

## 2018-03-14 LAB — CBC
HCT: 31.4 % — ABNORMAL LOW (ref 39.0–52.0)
Hemoglobin: 10.2 g/dL — ABNORMAL LOW (ref 13.0–17.0)
MCH: 30.6 pg (ref 26.0–34.0)
MCHC: 32.5 g/dL (ref 30.0–36.0)
MCV: 94.3 fL (ref 80.0–100.0)
Platelets: 145 10*3/uL — ABNORMAL LOW (ref 150–400)
RBC: 3.33 MIL/uL — ABNORMAL LOW (ref 4.22–5.81)
RDW: 13.2 % (ref 11.5–15.5)
WBC: 7.6 10*3/uL (ref 4.0–10.5)
nRBC: 0 % (ref 0.0–0.2)

## 2018-03-14 MED ORDER — AMOXICILLIN-POT CLAVULANATE 875-125 MG PO TABS
1.0000 | ORAL_TABLET | Freq: Two times a day (BID) | ORAL | Status: DC
  Administered 2018-03-14: 1 via ORAL
  Filled 2018-03-14: qty 1

## 2018-03-14 MED ORDER — AMOXICILLIN-POT CLAVULANATE 875-125 MG PO TABS: 1.0000 | ORAL_TABLET | Freq: Two times a day (BID) | ORAL | 0 refills | Status: DC

## 2018-03-14 MED ORDER — AZITHROMYCIN 500 MG PO TABS
500.0000 mg | ORAL_TABLET | Freq: Every day | ORAL | Status: AC
  Administered 2018-03-14: 500 mg via ORAL
  Filled 2018-03-14: qty 1

## 2018-03-14 MED ORDER — LEVOFLOXACIN 750 MG PO TABS: 750.0000 mg | ORAL_TABLET | Freq: Every day | ORAL | 0 refills | Status: AC

## 2018-03-14 MED ORDER — AZITHROMYCIN 500 MG PO TABS: 250.0000 mg | ORAL_TABLET | Freq: Every day | ORAL | Status: DC

## 2018-03-14 MED ORDER — AZITHROMYCIN 250 MG PO TABS: 500.0000 mg | ORAL_TABLET | Freq: Every day | ORAL | 0 refills | Status: DC

## 2018-03-14 NOTE — Progress Notes (Signed)
SATURATION QUALIFICATIONS: (This note is used to comply with regulatory documentation for home oxygen)  Patient Saturations on Room Air at Rest = 96%  Patient Saturations on Room Air while Ambulating = 95%  

## 2018-03-14 NOTE — Progress Notes (Signed)
Chaplain responded to spiritual consult. Pt requested prayer from [facility].  Chaplain responded and patient was standing, smiling, by bed.  Wife was bedside in chair. Chaplain inquired about pt's parish to call priest but he said he was from [place]. Pt said "I'm doing better" but "thank you for coming by."  Lynnell Chad Pager [phone]

## 2018-03-14 NOTE — Discharge Summary (Signed)
Name: Jimmy Frank MRN: [id number] DOB: Jul 10, 1935 83 y.o. PCP: System, Pcp Not In  Date of Admission: 03/13/2018  9:31 AM Date of Discharge: 03/14/2018 Attending Physician: Blanch Media  Discharge Diagnosis: 1. Community Acquired Pneumonia  Discharge Medications: Allergies as of 03/14/2018   No Known Allergies     Medication List    TAKE these medications   ALPRAZolam 0.25 MG tablet Commonly known as:  XANAX Take 0.25 mg by mouth at bedtime as needed for sleep.   aspirin EC 81 MG tablet Take 81 mg by mouth daily.   atorvastatin 40 MG tablet Commonly known as:  LIPITOR Take 40 mg by mouth daily.   levofloxacin 750 MG tablet Commonly known as:  LEVAQUIN Take 1 tablet (750 mg total) by mouth daily for 6 days.   omeprazole 20 MG capsule Commonly known as:  PRILOSEC Take 20 mg by mouth daily before breakfast.   tamsulosin 0.4 MG Caps capsule Commonly known as:  FLOMAX Take 0.4 mg by mouth daily.   vitamin B-12 500 MCG tablet Commonly known as:  CYANOCOBALAMIN Take 500 mcg by mouth daily.       Disposition and follow-up:   [name] was discharged from [facility] in Stable condition.  At the hospital follow up visit please address:  1.  Community Acquired Pneumonia - Discharged with 6 more days of levofloxacin - Please ensure he finished his abx course appropriately - Please confirm no oxygen requirements  2.  Labs / imaging needed at time of follow-up: None  3.  Pending labs/ test needing follow-up: Blood culture  Follow-up Appointments: Follow-up Information    [facility]. Call.   Contact information: [address] [place] [address] [phone]          Hospital Course by problem list:  1. Community Acquired Pneumonia: 83yo M w/ no significant past medical history admit for cough, fever, and chills. Found to be febrile with increased supplemental oxygen  requirement. Started on cefepime. Respiratory viral panel and influenza panel negative. Pro-calcitonin elevated at >70. Subjective improvement with no supplemental oxygen requirement after fluid resuscitation and one day on IV abx. Discharged on oral levofloxacin.   Discharge Vitals:   BP (!) 96/53 (BP Location: Left Arm)   Pulse 77   Temp 99.1 F (37.3 C) (Oral)   Resp 20   Ht 6' (1.829 m)   Wt 81.8 kg   SpO2 97%   BMI 24.47 kg/m   Pertinent Labs, Studies, and Procedures:   CBC Latest Ref Rng & Units 03/14/2018 03/13/2018  WBC 4.0 - 10.5 K/uL 7.6 5.9  Hemoglobin 13.0 - 17.0 g/dL 10.2(L) 13.5  Hematocrit 39.0 - 52.0 % 31.4(L) 43.0  Platelets 150 - 400 K/uL 145(L) 192   BMP Latest Ref Rng & Units 03/14/2018 03/13/2018  Glucose 70 - 99 mg/dL 92 956(O)  BUN 8 - 23 mg/dL 21 20  Creatinine 1.30 - 1.24 mg/dL 8.65 7.84(O)  Sodium 962 - 145 mmol/L 138 139  Potassium 3.5 - 5.1 mmol/L 3.5 3.6  Chloride 98 - 111 mmol/L 109 104  CO2 22 - 32 mmol/L 22 21(L)  Calcium 8.9 - 10.3 mg/dL 7.5(L) 8.8(L)   Discharge Instructions: Discharge Instructions    Call MD for:  difficulty breathing, headache or visual disturbances   Complete by:  As directed    Call MD for:  persistant dizziness or light-headedness   Complete by:  As directed  Call MD for:  persistant nausea and vomiting   Complete by:  As directed    Call MD for:  temperature >100.4   Complete by:  As directed    Diet - low sodium heart healthy   Complete by:  As directed    Increase activity slowly   Complete by:  As directed      [name]  You came to [place] with fever, chills, and cough. We have found you have pneumonia. Here are our recommendations for you at discharge:  - Please start levofloxacin 750mg  daily for 6 days - Please make sure to keep yourself well-hydrated  Thank you for choosing [facility]  Signed: Theotis Barrio, MD 03/14/2018, 1:31 PM   Pager: [phone]

## 2018-03-14 NOTE — Progress Notes (Signed)
  Date: 03/14/2018  Patient name: Jimmy Frank  Medical record number: [id number]  Date of birth: 06-Apr-1935   I have seen and evaluated Jimmy Frank and discussed their care with the Residency Team. 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  He was playing golf on the day prior to admission and felt well until the end of the game when he felt fatigued and off and developed a cough, headaches, fevers, and chills.  He treated his symptoms with Robitussin, Tylenol, and time but did not improve.  On the day of admission, he developed confusion and was brought to the ED.  He was found to be febrile with a temperature of 104.4 and an elevated lactate.  He has a history of pneumonias requiring admission.  The only admission I can find in the electronic health record was in 2013 and he was discharged on Cipro and Avelox.  This morning, he feels wonderful and would like to go home.  Vitals:   03/13/18 2205 03/14/18 0509  BP: 112/67 (!) 96/53  Pulse: 71 77  Resp: 18 20  Temp: (!) 97.5 F (36.4 C) 99.1 F (37.3 C)  SpO2: 100% 97%  24-hour T-max 99.1 O2 sat with ambulation 95% on room air Gen : 100% on RA while at rest, NAD, no resp distress HRRR no MRG LCATB with good air flow  Cr 1.36 - 1.16 LA 3.48 - 1.15 PCT 74 WBC 7.6 HgB 13.5 - 10.2 RVP negative UA small hemoglobin, 0-5 RBCs MRSA nasal swab negative Blood cultures pending  I personally viewed the CXR images and confirmed my reading with the official read. 1 view portable AP semierect.  No infiltrates.  I personally viewed the EKG and confirmed my reading with the official read.  Poor tracing, sinus, normal axis, no ischemic changes  Assessment and Plan: I have seen and evaluated the patient as outlined above. I agree with the formulated Assessment and Plan as detailed in the residents' note, with the following changes: Jimmy Frank is an 83 yo community dwelling, golf playing man.  He had a rather  abrupt onset of a febrile illness with subsequent confusion.  Pneumonia was felt to be the most likely etiology based on his symptoms, exam, and chest x-ray.  Additionally, his procalcitonin was significantly elevated at 74.  He made a remarkable improvement with resolution of his fever and symptoms after 24 hours on cefepime and is medically stable to be discharged to home.  We do not have culture data to guide antibiotics. Dr Nedra Frank got the history that he had a wound infection that caused pseudomonal bacteremia and those blood culture results are in care everywhere and it was in 2013.  Additionally, in 2010, he was discharged on Avelox and Cipro but we do not have a discharge summary and do not know the discharge diagnosis.  Prior pseudomonal infection is a strong risk factor for a pseudomonal pneumonia.  Although there are risk factors using a floraquinolone in an 83 year old man, I would favor pseudomonal coverage for his outpatient antibiotic regimen.  DC home on fluoroquinolone.   Burns Spain, MD 1/1/202011:00 AM

## 2018-03-14 NOTE — Discharge Instructions (Signed)
Jimmy Frank  You came to [place] with fever, chills, and cough. We have found you have pneumonia. Here are our recommendations for you at discharge:  - Please start levofloxacin 750mg  daily for 6 more days - Please make sure to keep yourself well-hydrated  Thank you for choosing [facility]   Community-Acquired Pneumonia, Adult Pneumonia is an infection of the lungs. It causes swelling in the airways of the lungs. Mucus and fluid may also build up inside the airways. One type of pneumonia can happen while a person is in a hospital. A different type can happen when a person is not in a hospital (community-acquired pneumonia).  What are the causes?  This condition is caused by germs (viruses, bacteria, or fungi). Some types of germs can be passed from one person to another. This can happen when you breathe in droplets from the cough or sneeze of an infected person. What increases the risk? You are more likely to develop this condition if you:  Have a long-term (chronic) disease, such as: ? Chronic obstructive pulmonary disease (COPD). ? Asthma. ? Cystic fibrosis. ? Congestive heart failure. ? Diabetes. ? Kidney disease.  Have HIV.  Have sickle cell disease.  Have had your spleen removed.  Do not take good care of your teeth and mouth (poor dental hygiene).  Have a medical condition that increases the risk of breathing in droplets from your own mouth and nose.  Have a weakened body defense system (immune system).  Are a smoker.  Travel to areas where the germs that cause this illness are common.  Are around certain animals or the places they live. What are the signs or symptoms?  A dry cough.  A wet (productive) cough.  Fever.  Sweating.  Chest pain. This often happens when breathing deeply or coughing.  Fast breathing or trouble breathing.  Shortness of breath.  Shaking chills.  Feeling tired (fatigue).  Muscle aches. How is this treated? Treatment for this  condition depends on many things. Most adults can be treated at home. In some cases, treatment must happen in a hospital. Treatment may include:  Medicines given by mouth or through an IV tube.  Being given extra oxygen.  Respiratory therapy. In rare cases, treatment for very bad pneumonia may include:  Using a machine to help you breathe.  Having a procedure to remove fluid from around your lungs. Follow these instructions at home: Medicines  Take over-the-counter and prescription medicines only as told by your doctor. ? Only take cough medicine if you are losing sleep.  If you were prescribed an antibiotic medicine, take it as told by your doctor. Do not stop taking the antibiotic even if you start to feel better. General instructions   Sleep with your head and neck raised (elevated). You can do this by sleeping in a recliner or by putting a few pillows under your head.  Rest as needed. Get at least 8 hours of sleep each night.  Drink enough water to keep your pee (urine) pale yellow.  Eat a healthy diet that includes plenty of vegetables, fruits, whole grains, low-fat dairy products, and lean protein.  Do not use any products that contain nicotine or tobacco. These include cigarettes, e-cigarettes, and chewing tobacco. If you need help quitting, ask your doctor.  Keep all follow-up visits as told by your doctor. This is important. How is this prevented? A shot (vaccine) can help prevent pneumonia. Shots are often suggested for:  People older than 81 years of  age.  People older than 81 years of age who: ? Are having cancer treatment. ? Have long-term (chronic) lung disease. ? Have problems with their body's defense system. You may also prevent pneumonia if you take these actions:  Get the flu (influenza) shot every year.  Go to the dentist as often as told.  Wash your hands often. If you cannot use soap and water, use hand sanitizer. Contact a doctor if:  You have  a fever.  You lose sleep because your cough medicine does not help. Get help right away if:  You are short of breath and it gets worse.  You have more chest pain.  Your sickness gets worse. This is very serious if: ? You are an older adult. ? Your body's defense system is weak.  You cough up blood. Summary  Pneumonia is an infection of the lungs.  Most adults can be treated at home. Some will need treatment in a hospital.  Drink enough water to keep your pee pale yellow.  Get at least 8 hours of sleep each night. This information is not intended to replace advice given to you by your health care provider. Make sure you discuss any questions you have with your health care provider. Document Released: 08/17/2007 Document Revised: 10/26/2017 Document Reviewed: 10/26/2017 Elsevier Interactive Patient Education  2019 [employer].

## 2018-03-14 NOTE — Progress Notes (Signed)
   Subjective:  Jimmy Frank is a 83 yo M w/ no significant PMH admit for pneumonia on hospital day 1.  Mr.Szczygiel was examined and evaluated at bedside this AM. He states he is feeling much better today and off oxygen. He is anxious to leave. States that he is ambulating without difficulty and eating well. Feels very well. Feels that the majority of his symptoms were due to dehydration and states that he will work to do better. Discussed that our work-up thus far has been negative and feel he likely has a bacterial pneumonia. Discuss switching him to PO antibiotics and discharge. He agrees.   Objective: Vital signs in last 24 hours: Vitals:   03/13/18 1606 03/13/18 2205 03/14/18 0100 03/14/18 0509  BP: 108/65 112/67  (!) 96/53  Pulse: 66 71  77  Resp: (!) 22 18  20   Temp: 98.3 F (36.8 C) (!) 97.5 F (36.4 C)  99.1 F (37.3 C)  TempSrc: Oral Oral  Oral  SpO2: 97% 100%  97%  Weight:   81.8 kg   Height:   6' (1.829 m)    Physical Exam  Constitutional: He is well-developed, well-nourished, and in no distress. No distress.  Eyes: Conjunctivae are normal.  Neck: Normal range of motion. Neck supple.  Cardiovascular: Normal rate, regular rhythm, normal heart sounds and intact distal pulses.  Pulmonary/Chest: Effort normal and breath sounds normal. He has no wheezes. He has no rales.  Abdominal: Soft. Bowel sounds are normal. There is no abdominal tenderness.  Musculoskeletal: Normal range of motion.        General: No edema.  Skin: He is not diaphoretic.   Assessment/Plan:  Active Problems:   Acute respiratory failure with hypoxia [facility])  Mr.Kleppe is a 83 yo M w/ PMH of diverticulosis, HTN, HLD, and recurrent pneumonias who presented dyspnea and altered mental status. No culture results but clinically looks significantly improved. If able to ambulate without desaturation will be able to be discharged today on oral antibiotics.  Acute hypoxic respiratory failure with encephalopathy 2/2  bacterial pneumonia  Afebrile since admission. Now saturating well on RA. Tolerating PO intake and ambulation. Viral panels negative. Plan to switch to PO antibiotics.  - Discharge today after ambulatory pulse ox if no desaturation event on levofloxacin  - Keep O2 sat >88  Acute Kidney Injury 2/2 dehydration Baseline creatinine 0.9 Admit creatinine 1.36 now down to 1.16 - Resolved   Hx of BPH - c/w home meds: tamsulosin 0.4mg  daily  Hx of GERD - C/w home meds: pantoprazole 40mg  daily  Hx of HLD - C/w atorvastatin 40mg  daily  DVT prophx: Lovenox Diet: Cardiac Bowel: Senokot Code: Full code  Dispo: Anticipated discharge today after ambulatory pulse ox.   Theotis Barrio, MD 03/14/2018, 12:18 PM Pager: [phone]

## 2018-03-16 ENCOUNTER — Telehealth: Payer: Self-pay | Admitting: Internal Medicine

## 2018-03-16 LAB — BLOOD CULTURE ID PANEL (REFLEXED)
Acinetobacter baumannii: NOT DETECTED
CANDIDA ALBICANS: NOT DETECTED
CANDIDA TROPICALIS: NOT DETECTED
Candida glabrata: NOT DETECTED
Candida krusei: NOT DETECTED
Candida parapsilosis: NOT DETECTED
Enterobacter cloacae complex: NOT DETECTED
Enterobacteriaceae species: NOT DETECTED
Enterococcus species: NOT DETECTED
Escherichia coli: NOT DETECTED
Haemophilus influenzae: NOT DETECTED
KLEBSIELLA PNEUMONIAE: NOT DETECTED
Klebsiella oxytoca: NOT DETECTED
Listeria monocytogenes: NOT DETECTED
Neisseria meningitidis: NOT DETECTED
Proteus species: NOT DETECTED
Pseudomonas aeruginosa: NOT DETECTED
Serratia marcescens: NOT DETECTED
Staphylococcus aureus (BCID): NOT DETECTED
Staphylococcus species: NOT DETECTED
Streptococcus agalactiae: NOT DETECTED
Streptococcus pneumoniae: NOT DETECTED
Streptococcus pyogenes: NOT DETECTED
Streptococcus species: NOT DETECTED

## 2018-03-16 NOTE — Telephone Encounter (Signed)
I was notified today that 1/4 cultures from the patient's recent hospitalization grew gram negative rods. These organisms were not identified on BCID. I have contacted the patient and he is feeling better without fevers, cough, or SOB. We discussed that we are still waiting on organisms identification but if antibiotics need to be changed I will notify him.   I also discussed the case with infectious disease who agrees with the plan. It would be reasonable to continue current antibiotics without extension of duration if the organism identified is susceptible to flouroquinolones.   Patient was told that if he developed SOB, fevers, cough, fatigue, or confusion to go to the ED immediately. He is from [place] and will be traveling back within the next couple days.

## 2018-03-18 LAB — CULTURE, BLOOD (ROUTINE X 2): Culture: NO GROWTH

## 2018-03-19 LAB — CULTURE, BLOOD (ROUTINE X 2): Special Requests: ADEQUATE

## 2018-03-21 ENCOUNTER — Telehealth: Payer: Self-pay | Admitting: Internal Medicine

## 2018-03-21 NOTE — Telephone Encounter (Signed)
Called the patient to discuss his spinal culture results. Told him that one out of four blood cultures group Bacteriodes. He has completed his treatment with levofloxacin and states that he is feeling back to his normal self. He states that he went out golfing yesterday without any issues. He will be heading back to [place] within the next month. I encouraged him to follow up with his primary care provider and that if he begins to fill any symptoms to seek further medical attention. I did give him the name of the bacteria in case he does get sick again. I also discussed the case with infectious disease and they agree that if the patient is feeling well he does not warrant any change in therapy.

## 2019-04-08 ENCOUNTER — Ambulatory Visit: Payer: Medicare Other | Attending: Internal Medicine

## 2019-04-08 DIAGNOSIS — Z23 Encounter for immunization: Secondary | ICD-10-CM | POA: Insufficient documentation

## 2019-04-08 NOTE — Progress Notes (Signed)
   Covid-19 Vaccination Clinic  Name:  Jimmy Frank    MRN: [id number] DOB: [birth year]  04/08/2019  [name] was observed post Covid-19 immunization for 15 minutes without incidence. He was provided with Vaccine Information Sheet and instruction to access the V-Safe system.   [name] was instructed to call 911 with any severe reactions post vaccine: Marland Kitchen Difficulty breathing  . Swelling of your face and throat  . A fast heartbeat  . A bad rash all over your body  . Dizziness and weakness    Immunizations Administered    Name Date Dose VIS Date Route   Pfizer COVID-19 Vaccine 04/08/2019 10:01 AM 0.3 mL 02/22/2019 Intramuscular   Manufacturer: [employer], [employer]   Lot: [id number]   NDC: [address]-3

## 2019-04-29 ENCOUNTER — Ambulatory Visit: Payer: Medicare Other | Attending: Internal Medicine

## 2019-04-29 DIAGNOSIS — Z23 Encounter for immunization: Secondary | ICD-10-CM | POA: Insufficient documentation

## 2019-04-29 NOTE — Progress Notes (Signed)
   Covid-19 Vaccination Clinic  Name:  Jimmy Frank    MRN: [id number] DOB: 1936/01/24  04/29/2019  [name] was observed post Covid-19 immunization for 15 minutes without incidence. He was provided with Vaccine Information Sheet and instruction to access the V-Safe system.   [name] was instructed to call 911 with any severe reactions post vaccine: Marland Kitchen Difficulty breathing  . Swelling of your face and throat  . A fast heartbeat  . A bad rash all over your body  . Dizziness and weakness    Immunizations Administered    Name Date Dose VIS Date Route   Pfizer COVID-19 Vaccine 04/29/2019 10:34 AM 0.3 mL 02/22/2019 Intramuscular   Manufacturer: [employer], [employer]   Lot: [id number]   NDC: [address]-5

## 2020-04-02 ENCOUNTER — Other Ambulatory Visit: Payer: Self-pay

## 2020-04-02 ENCOUNTER — Ambulatory Visit: Payer: Self-pay

## 2020-04-02 ENCOUNTER — Ambulatory Visit (INDEPENDENT_AMBULATORY_CARE_PROVIDER_SITE_OTHER): Payer: Medicare Other | Admitting: Orthopaedic Surgery

## 2020-04-02 DIAGNOSIS — M79641 Pain in right hand: Secondary | ICD-10-CM
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
## 2020-04-08 ENCOUNTER — Ambulatory Visit: Payer: Medicare Other | Admitting: Orthopaedic Surgery

## 2020-05-16 IMAGING — DX DG CHEST 1V PORT
1 series · 1 of 1 positions shown · non-contrast
Comparison: None.

CLINICAL DATA: Shortness of breath, fever.

EXAM:
PORTABLE CHEST 1 VIEW

[chest]
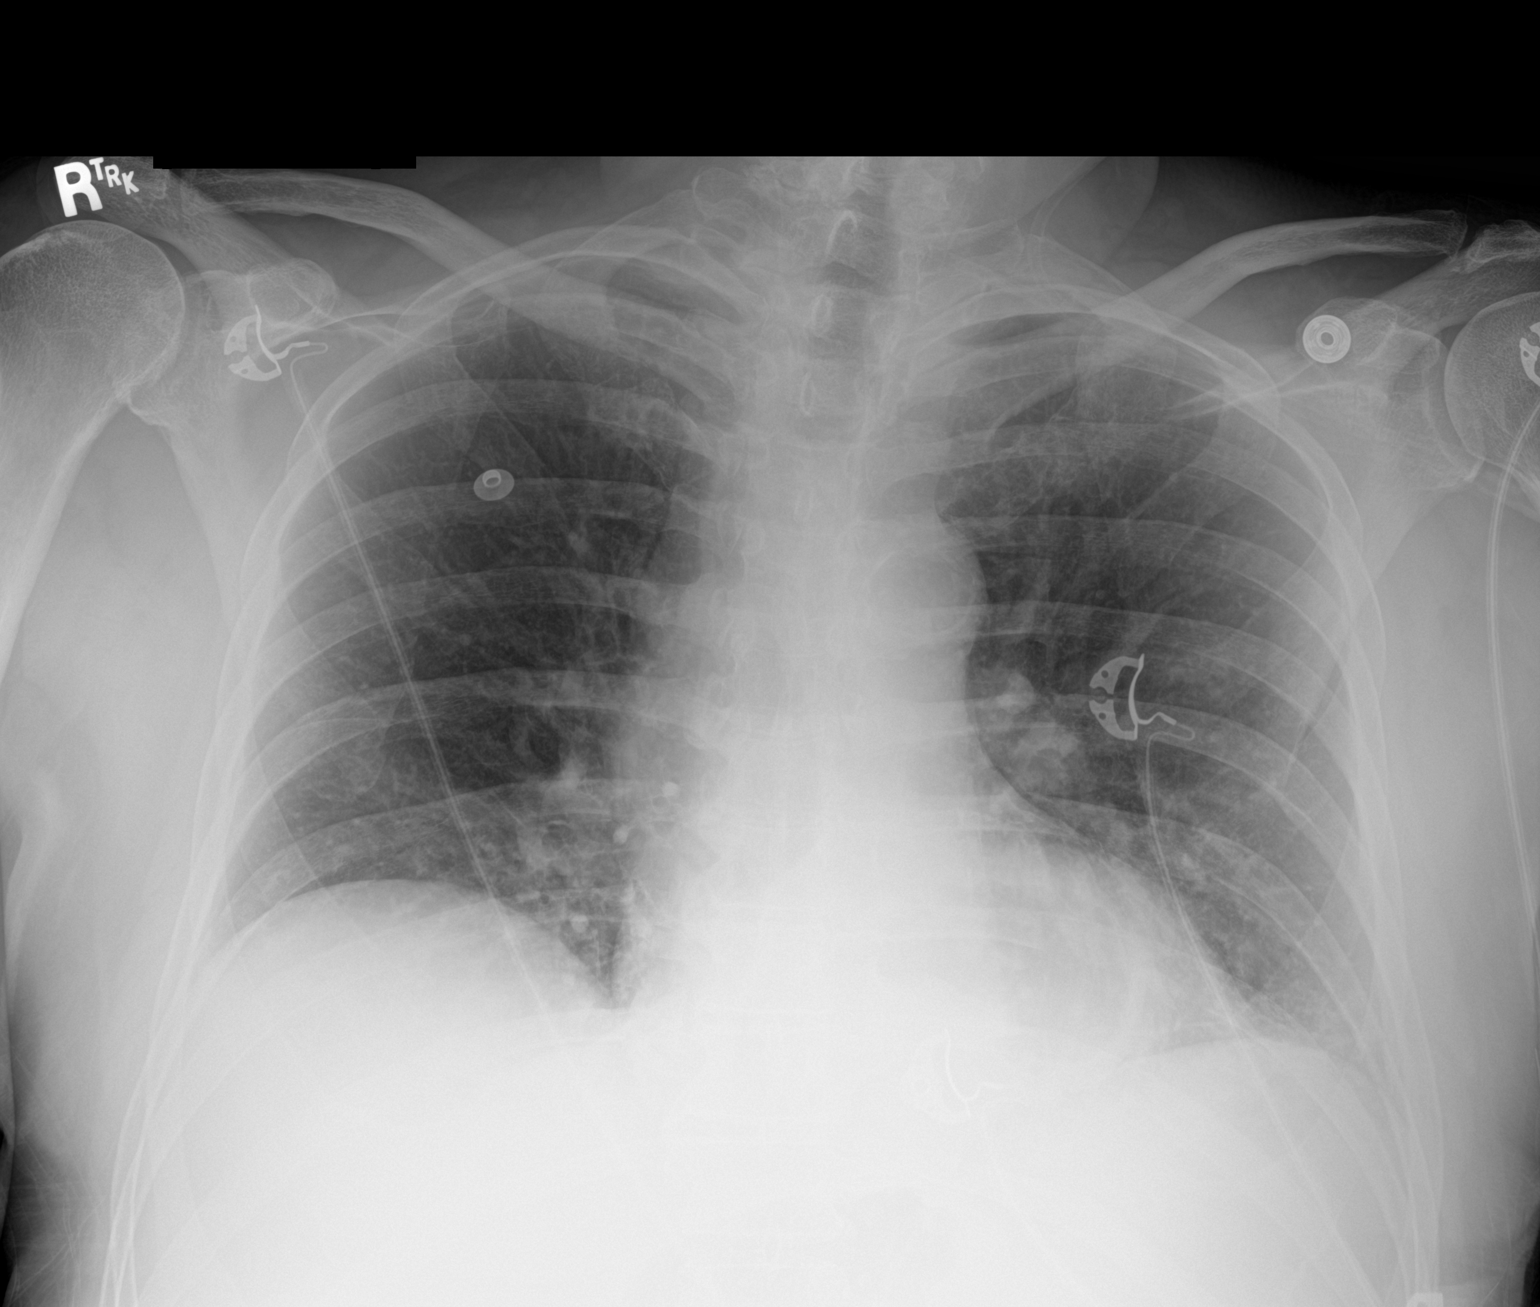

[1 of 1 positions shown; findings below may reference images not displayed]

FINDINGS: The heart size and mediastinal contours are within normal limits. No
pneumothorax or pleural effusion is noted. Probable mild bibasilar
subsegmental atelectasis is noted. The visualized skeletal
structures are unremarkable.
IMPRESSION: Probable mild bibasilar subsegmental atelectasis.
